# Patient Record
Sex: Male | Born: 1970 | Race: Black or African American | Hispanic: No | Marital: Single | Smoking: Current every day smoker
Health system: Southern US, Community
[De-identification: ages and names within clinical notes are randomized; demographics above are authoritative.]

## PROBLEM LIST (undated history)

## (undated) HISTORY — PX: FRACTURE SURGERY: SHX138

---

## 2001-08-15 ENCOUNTER — Encounter: Payer: Self-pay | Admitting: Emergency Medicine

## 2001-08-15 ENCOUNTER — Emergency Department (HOSPITAL_COMMUNITY): Admission: EM | Admit: 2001-08-15 | Discharge: 2001-08-15 | Payer: Self-pay | Admitting: Emergency Medicine

## 2002-05-10 ENCOUNTER — Emergency Department (HOSPITAL_COMMUNITY): Admission: EM | Admit: 2002-05-10 | Discharge: 2002-05-10 | Payer: Self-pay | Admitting: Emergency Medicine

## 2007-06-23 ENCOUNTER — Emergency Department (HOSPITAL_COMMUNITY): Admission: EM | Admit: 2007-06-23 | Discharge: 2007-06-24 | Payer: Self-pay | Admitting: Emergency Medicine

## 2007-06-27 ENCOUNTER — Emergency Department (HOSPITAL_COMMUNITY): Admission: EM | Admit: 2007-06-27 | Discharge: 2007-06-27 | Payer: Self-pay | Admitting: Emergency Medicine

## 2007-10-06 ENCOUNTER — Emergency Department (HOSPITAL_COMMUNITY): Admission: EM | Admit: 2007-10-06 | Discharge: 2007-10-06 | Payer: Self-pay | Admitting: Emergency Medicine

## 2007-10-12 ENCOUNTER — Emergency Department (HOSPITAL_COMMUNITY): Admission: EM | Admit: 2007-10-12 | Discharge: 2007-10-12 | Payer: Self-pay | Admitting: Emergency Medicine

## 2009-01-21 ENCOUNTER — Emergency Department (HOSPITAL_COMMUNITY): Admission: EM | Admit: 2009-01-21 | Discharge: 2009-01-21 | Payer: Self-pay | Admitting: Emergency Medicine

## 2012-02-20 ENCOUNTER — Encounter (HOSPITAL_COMMUNITY): Payer: Self-pay | Admitting: Emergency Medicine

## 2012-02-20 ENCOUNTER — Emergency Department (HOSPITAL_COMMUNITY)
Admission: EM | Admit: 2012-02-20 | Discharge: 2012-02-20 | Disposition: A | Discharge status: Home / Self Care | Payer: Self-pay | Attending: Emergency Medicine | Admitting: Emergency Medicine

## 2012-02-20 DIAGNOSIS — R739 Hyperglycemia, unspecified: Secondary | ICD-10-CM

## 2012-02-20 DIAGNOSIS — R7309 Other abnormal glucose: Secondary | ICD-10-CM | POA: Insufficient documentation

## 2012-02-20 DIAGNOSIS — R5381 Other malaise: Secondary | ICD-10-CM | POA: Insufficient documentation

## 2012-02-20 DIAGNOSIS — R112 Nausea with vomiting, unspecified: Secondary | ICD-10-CM | POA: Insufficient documentation

## 2012-02-20 DIAGNOSIS — R5383 Other fatigue: Secondary | ICD-10-CM | POA: Insufficient documentation

## 2012-02-20 LAB — CBC
Hemoglobin: 15.1 g/dL (ref 13.0–17.0)
MCH: 24.3 pg — ABNORMAL LOW (ref 26.0–34.0)
MCHC: 33.9 g/dL (ref 30.0–36.0)
Platelets: 159 10*3/uL (ref 150–400)
RDW: 14 % (ref 11.5–15.5)

## 2012-02-20 LAB — BASIC METABOLIC PANEL
Calcium: 9.6 mg/dL (ref 8.4–10.5)
GFR calc Af Amer: >90 mL/min (ref >90)
GFR calc non Af Amer: >90 mL/min (ref >90)
Potassium: 4 mEq/L (ref 3.5–5.1)
Sodium: 133 mEq/L — ABNORMAL LOW (ref 135–145)

## 2012-02-20 LAB — DIFFERENTIAL
Basophils Absolute: 0 10*3/uL (ref 0.0–0.1)
Basophils Relative: 0 % (ref 0–1)
Eosinophils Absolute: 0 10*3/uL (ref 0.0–0.7)
Lymphocytes Relative: 14 % (ref 12–46)
Monocytes Absolute: 0.9 10*3/uL (ref 0.1–1.0)
Neutrophils Relative %: 76 % (ref 43–77)

## 2012-02-20 LAB — HEPATIC FUNCTION PANEL
AST: 22 U/L (ref 0–37)
Albumin: 4.2 g/dL (ref 3.5–5.2)
Total Bilirubin: 0.3 mg/dL (ref 0.3–1.2)
Total Protein: 8.8 g/dL — ABNORMAL HIGH (ref 6.0–8.3)

## 2012-02-20 MED ORDER — SODIUM CHLORIDE 0.9 % IV BOLUS (SEPSIS)
1000.0000 mL | Freq: Once | INTRAVENOUS | Status: AC
  Administered 2012-02-20: 1000 mL via INTRAVENOUS

## 2012-02-20 MED ORDER — ONDANSETRON HCL 4 MG/2ML IJ SOLN
4.0000 mg | Freq: Once | INTRAMUSCULAR | Status: AC
  Administered 2012-02-20: 4 mg via INTRAVENOUS
  Filled 2012-02-20: qty 2

## 2012-02-20 MED ORDER — PANTOPRAZOLE SODIUM 40 MG IV SOLR
40.0000 mg | Freq: Once | INTRAVENOUS | Status: AC
  Administered 2012-02-20: 40 mg via INTRAVENOUS
  Filled 2012-02-20: qty 40

## 2012-02-20 MED ORDER — METFORMIN HCL 500 MG PO TABS: 500.0000 mg | ORAL_TABLET | Freq: Two times a day (BID) | ORAL | Status: AC

## 2012-02-20 MED ORDER — ONDANSETRON 8 MG PO TBDP: ORAL_TABLET | ORAL | Status: AC

## 2012-02-20 NOTE — ED Notes (Signed)
Per PT: several days of GI illness, vomited x 4-5 on Friday.

## 2012-02-20 NOTE — ED Provider Notes (Signed)
History     CSN: [id number]  Arrival date & time 02/20/12  0133   First MD Initiated Contact with Patient 02/20/12 [phone]      Chief Complaint  Patient presents with  . Emesis  . Diarrhea     HPI  History provided by the patient. Patient is a 41 year old male no significant past medical history who presents with complaints of nausea vomiting diarrhea for the past 4-5 days. Patient reports that symptoms have mostly been of nausea and vomiting. He did have one day of diarrhea and loose watery stools. This has since resolved. Patient states symptoms are worsened with any eating. Patient has been able tolerate some fluids. He denies fever, chills, sweats. Patient denies any myalgias or chest pain, shortness of breath or abdominal pains. Patient has not taken anything for her symptoms. Patient does report being around sick grandchildren ages 60 and 49. They also had some nausea vomiting diarrhea symptoms. Symptoms are described as moderate.    History reviewed. No pertinent past medical history.  Past Surgical History  Procedure Date  . Fracture surgery     History reviewed. No pertinent family history.  History  Substance Use Topics  . Smoking status: Current Everyday Smoker  . Smokeless tobacco: Not on file  . Alcohol Use: Yes     occassional      Review of Systems  Constitutional: Positive for appetite change and fatigue. Negative for fever and chills.  HENT: Negative for congestion, sore throat and rhinorrhea.   Respiratory: Negative for cough and shortness of breath.   Cardiovascular: Negative for chest pain.  Gastrointestinal: Positive for nausea, vomiting and diarrhea. Negative for abdominal pain and constipation.  Genitourinary: Negative for dysuria, frequency, hematuria and flank pain.    Allergies  Review of patient's allergies indicates no known allergies.  Home Medications  No current outpatient prescriptions on file.  BP 141/84  Pulse 102  Temp(Src) 98.1  F (36.7 C) (Oral)  Resp 20  SpO2 98%  Physical Exam  Nursing note and vitals reviewed. Constitutional: He is oriented to person, place, and time. He appears well-developed and well-nourished. No distress.  HENT:  Head: Normocephalic and atraumatic.  Mouth/Throat: Oropharynx is clear and moist.  Neck: Normal range of motion. Neck supple.       No meningeal sign  Cardiovascular: Normal rate and regular rhythm.   Pulmonary/Chest: Effort normal and breath sounds normal. No respiratory distress. He has no wheezes. He has no rales.  Abdominal: Soft. He exhibits no distension. There is no tenderness. There is no rebound and no guarding.  Neurological: He is alert and oriented to person, place, and time.  Skin: Skin is warm.  Psychiatric: He has a normal mood and affect. His behavior is normal.    ED Course  Procedures   Results for orders placed during the hospital encounter of 02/20/12  CBC      Component Value Range   WBC 8.7  4.0 - 10.5 (K/uL)   RBC 6.21 (*) 4.22 - 5.81 (MIL/uL)   Hemoglobin 15.1  13.0 - 17.0 (g/dL)   HCT 06.3  01.6 - 01.0 (%)   MCV 71.7 (*) 78.0 - 100.0 (fL)   MCH 24.3 (*) 26.0 - 34.0 (pg)   MCHC 33.9  30.0 - 36.0 (g/dL)   RDW 93.2  35.5 - 73.2 (%)   Platelets 159  150 - 400 (K/uL)  DIFFERENTIAL      Component Value Range   Neutrophils Relative 76  43 -  77 (%)   Lymphocytes Relative 14  12 - 46 (%)   Monocytes Relative 10  3 - 12 (%)   Eosinophils Relative 0  0 - 5 (%)   Basophils Relative 0  0 - 1 (%)   Neutro Abs 6.6  1.7 - 7.7 (K/uL)   Lymphs Abs 1.2  0.7 - 4.0 (K/uL)   Monocytes Absolute 0.9  0.1 - 1.0 (K/uL)   Eosinophils Absolute 0.0  0.0 - 0.7 (K/uL)   Basophils Absolute 0.0  0.0 - 0.1 (K/uL)   Smear Review MORPHOLOGY UNREMARKABLE    BASIC METABOLIC PANEL      Component Value Range   Sodium 133 (*) 135 - 145 (mEq/L)   Potassium 4.0  3.5 - 5.1 (mEq/L)   Chloride 92 (*) 96 - 112 (mEq/L)   CO2 28  19 - 32 (mEq/L)   Glucose, Bld 278 (*) 70 - 99  (mg/dL)   BUN 18  6 - 23 (mg/dL)   Creatinine, Ser 1.61  0.50 - 1.35 (mg/dL)   Calcium 9.6  8.4 - 09.6 (mg/dL)   GFR calc non Af Amer >90  >90 (mL/min)   GFR calc Af Amer >90  >90 (mL/min)       1. Hyperglycemia   2. Nausea & vomiting       MDM  3:40 AM patient seen and evaluated. Patient no acute distress. Patient does not fill ill or toxic.   Discussed with patient findings of elevated blood glucose. Patient with no prior history of diabetes. Discuss with patient options for admission or to stay for social worker and case management consult to arrange help with PCP followup in diabetes medications. Patient states that his mother and family have history of diabetes and he has access to home glucose monitoring. Patient states she does not want to stay for offered treatments. We will plan to start patient on metformin.   Patient also discussed with attending physician. Will obtain LFTs before starting patient on metformin. Patient also discussed with Trixie Dredge PAC. She will await LFTs and if normal discharge with prescriptions.  Angus Seller, [place] 02/20/12 [phone]

## 2012-02-20 NOTE — ED Notes (Signed)
Pt demanding to have something to drink. RN informed pt that since he was here for nausea and vomiting and had just vomited after eating ice chips, he would need to remain NPO. Pt agitated and angry that he cannot have anything to drink though he did admit to throwing up after eating ice chips.

## 2012-02-20 NOTE — Discharge Instructions (Signed)
You were treated today for your symptoms of nausea and vomiting diarrhea. Your lab testing today showed that you have a elevated blood sugar level concerning for diabetes. Your providers recommended starting you on medications to treat her elevated sugar. It is recommended that you followup with a primary care provider for continued evaluation and treatment of possible diabetes. Please make an appointment next week for close follow up. If you have any worsening symptoms please return to the emergency room.   Hyperglycemia Hyperglycemia occurs when the glucose (sugar) in your blood is too high. Hyperglycemia can happen for many reasons, but it most often happens to people who do not know they have diabetes or are not managing their diabetes properly.  CAUSES  Whether you have diabetes or not, there are other causes of hyperglycemia. Hyperglycemia can occur when you have diabetes, but it can also occur in other situations that you might not be as aware of, such as: Diabetes  If you have diabetes and are having problems controlling your blood glucose, hyperglycemia could occur because of some of the following reasons:   Not following your meal plan.   Not taking your diabetes medications or not taking it properly.   Exercising less or doing less activity than you normally do.   Being sick.  Pre-diabetes  This cannot be ignored. Before people develop Type 2 diabetes, they almost always have "pre-diabetes." This is when your blood glucose levels are higher than normal, but not yet high enough to be diagnosed as diabetes. Research has shown that some long-term damage to the body, especially the heart and circulatory system, may already be occurring during pre-diabetes. If you take action to manage your blood glucose when you have pre-diabetes, you may delay or prevent Type 2 diabetes from developing.  Stress  If you have diabetes, you may be "diet" controlled or on oral medications or insulin to  control your diabetes. However, you may find that your blood glucose is higher than usual in the hospital whether you have diabetes or not. This is often referred to as "stress hyperglycemia." Stress can elevate your blood glucose. This happens because of hormones put out by the body during times of stress. If stress has been the cause of your high blood glucose, it can be followed regularly by your caregiver. That way he/she can make sure your hyperglycemia does not continue to get worse or progress to diabetes.  Steroids  Steroids are medications that act on the infection fighting system (immune system) to block inflammation or infection. One side effect can be a rise in blood glucose. Most people can produce enough extra insulin to allow for this rise, but for those who cannot, steroids make blood glucose levels go even higher. It is not unusual for steroid treatments to "uncover" diabetes that is developing. It is not always possible to determine if the hyperglycemia will go away after the steroids are stopped. A special blood test called an A1c is sometimes done to determine if your blood glucose was elevated before the steroids were started.  SYMPTOMS  Thirsty.   Frequent urination.   Dry mouth.   Blurred vision.   Tired or fatigue.   Weakness.   Sleepy.   Tingling in feet or leg.  DIAGNOSIS  Diagnosis is made by monitoring blood glucose in one or all of the following ways:  A1c test. This is a chemical found in your blood.   Fingerstick blood glucose monitoring.   Laboratory results.  TREATMENT  First, knowing the cause of the hyperglycemia is important before the hyperglycemia can be treated. Treatment may include, but is not be limited to:  Education.   Change or adjustment in medications.   Change or adjustment in meal plan.   Treatment for an illness, infection, etc.   More frequent blood glucose monitoring.   Change in exercise plan.   Decreasing or stopping  steroids.   Lifestyle changes.  HOME CARE INSTRUCTIONS   Test your blood glucose as directed.   Exercise regularly. Your caregiver will give you instructions about exercise. Pre-diabetes or diabetes which comes on with stress is helped by exercising.   Eat wholesome, balanced meals. Eat often and at regular, fixed times. Your caregiver or nutritionist will give you a meal plan to guide your sugar intake.   Being at an ideal weight is important. If needed, losing as little as 10 to 15 pounds may help improve blood glucose levels.  SEEK MEDICAL CARE IF:   You have questions about medicine, activity, or diet.   You continue to have symptoms (problems such as increased thirst, urination, or weight gain).  SEEK IMMEDIATE MEDICAL CARE IF:   You are vomiting or have diarrhea.   Your breath smells fruity.   You are breathing faster or slower.   You are very sleepy or incoherent.   You have numbness, tingling, or pain in your feet or hands.   You have chest pain.   Your symptoms get worse even though you have been following your caregiver's orders.   If you have any other questions or concerns.  Document Released: 04/21/2001 Document Revised: 10/15/2011 Document Reviewed: 06/17/2009 [facility] Patient Information 2012 [place], [place].   Diabetes, Type 2, Am I At Risk? Diabetes is a lasting (chronic) disease. In type 2 diabetes, the pancreas does not make enough insulin, and the body does not respond normally to the insulin that is made. This type of diabetes was also previously called adult onset diabetes. About 90% of all those who have diabetes have type 2. It usually occurs after the age of 5, but can occur at any age.  People develop type 2 diabetes because they do not use insulin properly. Eventually, the pancreas cannot make enough insulin for the body's needs. Over time, the amount of glucose (sugar) in the blood increases. RISK FACTORS  Overweight - the more weight you have,  the more resistant your cells become to insulin.   Family history - you are more likely to get diabetes if a parent or sibling has diabetes.   Race - certain races get diabetes more.   African Americans.   American Indians.   Asian Americans.   Hispanics.   Pacific Islander.   Inactive - exercise helps control weight and helps your cells be more sensitive to insulin.   Gestational diabetes - some women develop diabetes while they are pregnant. This goes away when they deliver. However, they are 50-60% more likely to develop type 2 diabetes at a later time.   Having a baby over 9 pounds - a sign that you may have had gestational diabetes.   Age - the risk of diabetes goes up as you get older, especially after age 74.   High blood pressure (hypertension).  SYMPTOMS Many people have no signs or symptoms. Symptoms can be so mild that you might not even notice them. Some of these signs are:  Increased thirst.   Increased hunger.   Tiredness (fatigue).   Increased urination, especially  at night.   Weight loss.   Blurred vision.   Sores that do not heal.  WHO SHOULD BE TESTED?  Anyone 45 years or older, especially if overweight, should consider getting tested.   If you are younger than 45, overweight, and have one or more of the risk factors, you should consider getting tested.  DIAGNOSIS  Fasting blood glucose (FBS). Usually, 2 are done.   FBS 101-125 mg/dl is considered pre-diabetes.   FBS 126 mg/dl or greater is considered diabetes.   2 hour Oral Glucose Tolerance Test (OGTT). This test is preformed by first having you not eat or drink for several hours. You are then given something sweet to drink and your blood glucose is measured fasting, at one hour and 2 hours. This test tells how well you are able to handle sugars or carbohydrates.   Fasting: 60-100 mg/dl.   1 hour: less than 200 mg/dl.   2 hours: less than 140 mg/dl.   A1c -A1c is a blood glucose test  that gives and average of your blood glucose over 3 months. It is the accepted method to use to diagnose diabetes.   A1c 5.7-6.4% is considered pre-diabetes.   A1c 6.5% or greater is considered diabetes.  WHAT DOES IT MEAN TO HAVE PRE-DIABETES? Pre-diabetes means you are at risk for getting type 2 diabetes. Your blood glucose is higher than normal, but not yet high enough to diagnose diabetes. The good news is, if you have pre-diabetes you can reduce the risk of getting diabetes and even return to normal blood glucose levels. With modest weight loss and moderate physical activity, you can delay or prevent type 2 diabetes.  PREVENTION You cannot do anything about race, age or family history, but you can lower your chances of getting diabetes. You can:   Exercise regularly and be active.   Reduce fat and calorie intake.   Make wise food choices as much as you can.   Reduce your intake of salt and alcohol.   Maintain a reasonable weight.   Keep blood pressure in an acceptable range. Take medication if needed.   Not smoke.   Maintain an acceptable cholesterol level (HDL, LDL, Triglycerides). Take medication if needed.  DOING MY PART: GETTING STARTED Making big changes in your life is hard, especially if you are faced with more than one change. You can make it easier by taking these steps:  Make a plan to change behavior.   Decide exactly what you will do and when you will do it.   Plan what you need to get ready.   Think about what might prevent you from reaching your goals.   Find family and friends who will support and encourage you.   Decide how you will reward yourself when you do what you have planned.   Your doctor, dietitian, or counselor can help you make a plan.  HERE ARE SOME OF THE AREAS YOU MAY WISH TO CHANGE TO REDUCE YOUR RISK OF DIABETES. If you are overweight or obese, choose sensible ways to get in shape. Even small amounts of weight loss, like 5-10 pounds, can  help reduce the effects of insulin resistance and help blood glucose control. Diet  Avoid crash diets. Instead, eat less of the foods you usually have. Limit the amount of fat you eat.   Increase your physical activity. Aim for at least 30 minutes of exercise most days of the week.   Set a reasonable weight-loss goal, such as losing  1 pound a week. Aim for a long-term goal of losing 5-7% of your total body weight.   Make wise food choices most of the time.   What you eat has a big impact on your health. By making wise food choices, you can help control your body weight, blood pressure, and cholesterol.   Take a hard look at the serving sizes of the foods you eat. Reduce serving sizes of meat, desserts, and foods high in fat. Increase your intake of fruits and vegetables.   Limit your fat intake to about 25% of your total calories. For example, if your food choices add up to about 2,000 calories a day, try to eat no more than 56 grams of fat. Your caregiver or a dietitian can help you figure out how much fat to have. You can check food labels for fat content too.   You may also want to reduce the number of calories you have each day.   Keep a food log. Write down what you eat, how much you eat, and anything else that helps keep you on track.   When you meet your goal, reward yourself with a nonfood item or activity.  Exercise  Be physically active every day.   Keep and exercise log. Write down what exercise you did, for how long, and anything else that keeps you on track.   Regular exercise (like brisk walking) tackles several risk factors at once. It helps you lose weight, it keeps your cholesterol and blood pressure under control, and it helps your body use insulin. People who are physically active for 30 minutes a day, 5 days a week, reduced their risk of type 2 diabetes. If you are not very active, you should start slowly at first. Talk with your caregiver first about what kinds of  exercise would be safe for you. Make a plan to increase your activity level with the goal of being active for at least 30 minutes a day, most days of the week.   Choose activities you enjoy. Here are some ways to work extra activity into your daily routine:   Take the stairs rather than an elevator or escalator.   Park at the far end of the lot and walk.   Get off the bus a few stops early and walk the rest of the way.   Walk or bicycle instead of drive whenever you can.  Medications Some people need medication to help control their blood pressure or cholesterol levels. If you do, take your medicines as directed. Ask your caregiver whether there are any medicines you can take to prevent type 2 diabetes. Document Released: 10/29/2003 Document Revised: 10/15/2011 Document Reviewed: 07/24/2009 [facility] Patient Information 2012 [place], [place].    Diabetes, Frequently Asked Questions WHAT IS DIABETES? Most of the food we eat is turned into glucose (sugar). Our bodies use it for energy. The pancreas makes a hormone called insulin. It helps glucose get into the cells of our bodies. When you have diabetes, your body either does not make enough insulin or cannot use its own insulin as well as it should. This causes sugars to build up in your blood. WHAT ARE THE SYMPTOMS OF DIABETES?  Frequent urination.   Excessive thirst.   Unexplained weight loss.   Extreme hunger.   Blurred vision.   Tingling or numbness in hands or feet.   Feeling very tired much of the time.   Dry, itchy skin.   Sores that are slow to heal.  Yeast infections.  WHAT ARE THE TYPES OF DIABETES? Type 1 Diabetes   About 10% of affected people have this type.   Usually occurs before the age of 5.   Usually occurs in thin to normal weight people.  Type 2 Diabetes  About 90% of affected people have this type.   Usually occurs after the age of 30.   Usually occurs in overweight people.   More likely to  have:   A family history of diabetes.   A history of diabetes during pregnancy (gestational diabetes).   High blood pressure.   High cholesterol and triglycerides.  Gestational Diabetes  Occurs in about 4% of pregnancies.   Usually goes away after the baby is born.   More likely to occur in women with:   Family history of diabetes.   Previous gestational diabetes.   Obese.   Over 45 years old.  WHAT IS PRE-DIABETES? Pre-diabetes means your blood glucose is higher than normal, but lower than the diabetes range. It also means you are at risk of getting type 2 diabetes and heart disease. If you are told you have pre-diabetes, have your blood glucose checked again in 1 to 2 years. WHAT IS THE TREATMENT FOR DIABETES? Treatment is aimed at keeping blood glucose near normal levels at all times. Learning how to manage this yourself is important in treating diabetes. Depending on the type of diabetes you have, your treatment will include one or more of the following:  Monitoring your blood glucose.   Meal planning.   Exercise.   Oral medicine (pills) or insulin.  CAN DIABETES BE PREVENTED? With type 1 diabetes, prevention is more difficult, because the triggers that cause it are not yet known. With type 2 diabetes, prevention is more likely, with lifestyle changes:  Maintain a healthy weight.   Eat healthy.   Exercise.  IS THERE A CURE FOR DIABETES? No, there is no cure for diabetes. There is a lot of research going on that is looking for a cure, and progress is being made. Diabetes can be treated and controlled. People with diabetes can manage their diabetes and lead normal, active lives. SHOULD I BE TESTED FOR DIABETES? If you are at least 42 years old, you should be tested for diabetes. You should be tested again every 3 years. If you are 45 or older and overweight, you may want to get tested more often. If you are younger than 45, overweight, and have one or more of the  following risk factors, you should be tested:  Family history of diabetes.   Inactive lifestyle.   High blood pressure.  WHAT ARE SOME OTHER SOURCES FOR INFORMATION ON DIABETES? The following organizations may help in your search for more information on diabetes: National Diabetes Education Program (NDEP) Internet: SolarDiscussions.es American Diabetes Association Internet: http://www.diabetes.org  Juvenile Diabetes Foundation International Internet: WetlessWash.is Document Released: 10/29/2003 Document Revised: 10/15/2011 Document Reviewed: 08/23/2009 [facility] Patient Information 2012 [place], [place].    Nausea and Vomiting Nausea is a sick feeling that often comes before throwing up (vomiting). Vomiting is a reflex where stomach contents come out of your mouth. Vomiting can cause severe loss of body fluids (dehydration). Children and elderly adults can become dehydrated quickly, especially if they also have diarrhea. Nausea and vomiting are symptoms of a condition or disease. It is important to find the cause of your symptoms. CAUSES   Direct irritation of the stomach lining. This irritation can result from increased acid production (gastroesophageal reflux disease),  infection, food poisoning, taking certain medicines (such as nonsteroidal anti-inflammatory drugs), alcohol use, or tobacco use.   Signals from the brain.These signals could be caused by a headache, heat exposure, an inner ear disturbance, increased pressure in the brain from injury, infection, a tumor, or a concussion, pain, emotional stimulus, or metabolic problems.   An obstruction in the gastrointestinal tract (bowel obstruction).   Illnesses such as diabetes, hepatitis, gallbladder problems, appendicitis, kidney problems, cancer, sepsis, atypical symptoms of a heart attack, or eating disorders.   Medical treatments such as chemotherapy and radiation.   Receiving medicine that makes you sleep  (general anesthetic) during surgery.  DIAGNOSIS Your caregiver may ask for tests to be done if the problems do not improve after a few days. Tests may also be done if symptoms are severe or if the reason for the nausea and vomiting is not clear. Tests may include:  Urine tests.   Blood tests.   Stool tests.   Cultures (to look for evidence of infection).   X-rays or other imaging studies.  Test results can help your caregiver make decisions about treatment or the need for additional tests. TREATMENT You need to stay well hydrated. Drink frequently but in small amounts.You may wish to drink water, sports drinks, clear broth, or eat frozen ice pops or gelatin dessert to help stay hydrated.When you eat, eating slowly may help prevent nausea.There are also some antinausea medicines that may help prevent nausea. HOME CARE INSTRUCTIONS   Take all medicine as directed by your caregiver.   If you do not have an appetite, do not force yourself to eat. However, you must continue to drink fluids.   If you have an appetite, eat a normal diet unless your caregiver tells you differently.   Eat a variety of complex carbohydrates (rice, wheat, potatoes, bread), lean meats, yogurt, fruits, and vegetables.   Avoid high-fat foods because they are more difficult to digest.   Drink enough water and fluids to keep your urine clear or pale yellow.   If you are dehydrated, ask your caregiver for specific rehydration instructions. Signs of dehydration may include:   Severe thirst.   Dry lips and mouth.   Dizziness.   Dark urine.   Decreasing urine frequency and amount.   Confusion.   Rapid breathing or pulse.  SEEK IMMEDIATE MEDICAL CARE IF:   You have blood or brown flecks (like coffee grounds) in your vomit.   You have black or bloody stools.   You have a severe headache or stiff neck.   You are confused.   You have severe abdominal pain.   You have chest pain or trouble  breathing.   You do not urinate at least once every 8 hours.   You develop cold or clammy skin.   You continue to vomit for longer than 24 to 48 hours.   You have a fever.  MAKE SURE YOU:   Understand these instructions.   Will watch your condition.   Will get help right away if you are not doing well or get worse.  Document Released: 10/26/2005 Document Revised: 10/15/2011 Document Reviewed: 03/25/2011 [facility] Patient Information 2012 [place], [place].   RESOURCE GUIDE  Dental Problems  Patients with Medicaid: [facility] [phone] [address]  [address] Phone:  [phone]                                                  Phone:  [phone]  If unable to pay or uninsured, contact:  [facility] or [facility]. to become qualified for the adult dental clinic.  Chronic Pain Problems Contact [facility]  [phone] Patients need to be referred by their primary care doctor.  Insufficient Money for Medicine Contact [employer]:  call "211" or [facility] 262-699-5008.  No Primary Care Doctor Call [facility]  [phone] Other agencies that provide inexpensive medical care    [facility]  [phone]    [facility]  [phone]    [facility]  [phone]    [facility]  [phone]    [facility]  [phone]    [facility]  [phone]  Psychological Services [facility]  [phone] [facility]  [phone] [facility]   [phone] (emergency services [phone])  Substance Abuse Resources [facility]  209-192-5873 [facility] 508-681-2506 [facility] [phone] Floydene Flock [phone] Residential & Outpatient Substance Abuse Program  [phone]  Abuse/Neglect [facility] Child Abuse Hotline 714-469-5350 [facility] Child Abuse Hotline (404)821-0848 (After Hours)  Emergency Shelter [facility] (703)750-4765  Maternity Homes [facility] [place] [facility] 506-737-4626 [facility] 701-524-7615  MRSA Hotline #:   726-629-0973    [facility] Resources  [facility]     [employer]                          [facility] Dept. [address] [place]                       [address]      [address]  Blondell Reveal Phone:  [phone]                                   Phone:  [phone]                 Phone:  [phone]  [facility] Mental [facility] Phone:  [phone]  [facility] Child Abuse Hotline 727-299-4181 [phone] (After Hours)

## 2012-02-21 NOTE — ED Provider Notes (Signed)
Medical screening examination/treatment/procedure(s) were performed by non-physician practitioner and as supervising physician I was immediately available for consultation/collaboration.   Vida Roller, MD 02/21/12 [id number]

## 2020-10-22 ENCOUNTER — Encounter (HOSPITAL_COMMUNITY): Payer: Self-pay

## 2020-10-22 ENCOUNTER — Ambulatory Visit (HOSPITAL_COMMUNITY)
Admission: EM | Admit: 2020-10-22 | Discharge: 2020-10-22 | Disposition: A | Discharge status: Home / Self Care | Payer: Self-pay | Attending: Family Medicine | Admitting: Family Medicine

## 2020-10-22 ENCOUNTER — Other Ambulatory Visit: Payer: Self-pay

## 2020-10-22 DIAGNOSIS — S39012A Strain of muscle, fascia and tendon of lower back, initial encounter: Secondary | ICD-10-CM

## 2020-10-22 DIAGNOSIS — S161XXA Strain of muscle, fascia and tendon at neck level, initial encounter: Secondary | ICD-10-CM

## 2020-10-22 MED ORDER — NAPROXEN 500 MG PO TABS: 500.0000 mg | ORAL_TABLET | Freq: Two times a day (BID) | ORAL | 0 refills | Status: DC

## 2020-10-22 MED ORDER — CYCLOBENZAPRINE HCL 10 MG PO TABS: 10.0000 mg | ORAL_TABLET | Freq: Three times a day (TID) | ORAL | 0 refills | Status: DC | PRN
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
## 2020-11-29 ENCOUNTER — Ambulatory Visit (HOSPITAL_COMMUNITY)
Admission: EM | Admit: 2020-11-29 | Discharge: 2020-11-29 | Disposition: A | Discharge status: Home / Self Care | Payer: Self-pay | Attending: Family Medicine | Admitting: Family Medicine

## 2020-11-29 ENCOUNTER — Other Ambulatory Visit: Payer: Self-pay

## 2020-11-29 ENCOUNTER — Encounter (HOSPITAL_COMMUNITY): Payer: Self-pay

## 2020-11-29 DIAGNOSIS — M25551 Pain in right hip: Secondary | ICD-10-CM

## 2020-11-29 DIAGNOSIS — M25511 Pain in right shoulder: Secondary | ICD-10-CM

## 2020-11-29 MED ORDER — CYCLOBENZAPRINE HCL 10 MG PO TABS: 10.0000 mg | ORAL_TABLET | Freq: Three times a day (TID) | ORAL | 0 refills | Status: DC | PRN

## 2020-11-29 MED ORDER — NAPROXEN 500 MG PO TABS: 500.0000 mg | ORAL_TABLET | Freq: Two times a day (BID) | ORAL | 0 refills | Status: DC

## 2020-11-29 MED ORDER — KETOROLAC TROMETHAMINE 60 MG/2ML IM SOLN
60.0000 mg | Freq: Once | INTRAMUSCULAR | Status: AC
  Administered 2020-11-29: 60 mg via INTRAMUSCULAR

## 2020-11-29 MED ORDER — KETOROLAC TROMETHAMINE 60 MG/2ML IM SOLN
INTRAMUSCULAR | Status: AC
  Filled 2020-11-29: qty 2

## 2020-11-29 MED ORDER — KETOROLAC TROMETHAMINE 30 MG/ML IJ SOLN: 30.0000 mg | Freq: Once | INTRAMUSCULAR | 0 refills | Status: AC
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
## 2020-11-29 NOTE — Discharge Instructions (Addendum)
You were given a Toradol injection while here in clinic today therefore do not take the naproxen until bedtime tonight.  You can wait 2 hours and take a cyclobenzaprine if needed.  Recommend applications of heat to your shoulder and hip to improve pain relief.
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
## 2020-12-11 ENCOUNTER — Ambulatory Visit (INDEPENDENT_AMBULATORY_CARE_PROVIDER_SITE_OTHER): Payer: Self-pay

## 2020-12-11 ENCOUNTER — Ambulatory Visit (HOSPITAL_COMMUNITY)
Admission: EM | Admit: 2020-12-11 | Discharge: 2020-12-11 | Disposition: A | Discharge status: Home / Self Care | Payer: Self-pay | Attending: Family Medicine | Admitting: Family Medicine

## 2020-12-11 ENCOUNTER — Encounter (HOSPITAL_COMMUNITY): Payer: Self-pay

## 2020-12-11 DIAGNOSIS — M545 Low back pain, unspecified: Secondary | ICD-10-CM

## 2020-12-11 DIAGNOSIS — S39012A Strain of muscle, fascia and tendon of lower back, initial encounter: Secondary | ICD-10-CM

## 2020-12-11 MED ORDER — NAPROXEN 500 MG PO TABS: 500.0000 mg | ORAL_TABLET | Freq: Two times a day (BID) | ORAL | 0 refills | Status: AC

## 2020-12-11 MED ORDER — CYCLOBENZAPRINE HCL 10 MG PO TABS: 10.0000 mg | ORAL_TABLET | Freq: Three times a day (TID) | ORAL | 0 refills | Status: AC | PRN
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
